# Patient Record
Sex: Male | Born: 1965 | Race: White | Hispanic: Yes | Marital: Married
Health system: Southern US, Community
[De-identification: ages and names within clinical notes are randomized; demographics above are authoritative.]

## PROBLEM LIST (undated history)

## (undated) DIAGNOSIS — IMO0001 Reserved for inherently not codable concepts without codable children: Secondary | ICD-10-CM

---

## 2009-07-22 ENCOUNTER — Observation Stay (HOSPITAL_COMMUNITY): Admission: EM | Admit: 2009-07-22 | Discharge: 2009-07-22 | Payer: Self-pay | Admitting: Emergency Medicine

## 2010-07-25 DIAGNOSIS — IMO0001 Reserved for inherently not codable concepts without codable children: Secondary | ICD-10-CM

## 2010-07-25 HISTORY — DX: Reserved for inherently not codable concepts without codable children: IMO0001

## 2010-10-09 ENCOUNTER — Emergency Department (HOSPITAL_COMMUNITY)
Admission: EM | Admit: 2010-10-09 | Discharge: 2010-10-09 | Disposition: A | Discharge status: Home / Self Care | Payer: Self-pay | Attending: Emergency Medicine | Admitting: Emergency Medicine

## 2010-10-09 DIAGNOSIS — R42 Dizziness and giddiness: Secondary | ICD-10-CM | POA: Insufficient documentation

## 2010-10-09 LAB — GLUCOSE, CAPILLARY: Glucose-Capillary: 90 mg/dL (ref 70–99)

## 2010-10-09 LAB — URINALYSIS, ROUTINE W REFLEX MICROSCOPIC
Bilirubin Urine: NEGATIVE
Glucose, UA: NEGATIVE mg/dL
Hgb urine dipstick: NEGATIVE
Protein, ur: NEGATIVE mg/dL
Urobilinogen, UA: 1 mg/dL (ref 0.0–1.0)

## 2010-10-25 LAB — BASIC METABOLIC PANEL
BUN: 9 mg/dL (ref 6–23)
CO2: 25 mEq/L (ref 19–32)
Calcium: 9.4 mg/dL (ref 8.4–10.5)
GFR calc non Af Amer: >60 mL/min (ref >60)
Glucose, Bld: 116 mg/dL — ABNORMAL HIGH (ref 70–99)
Potassium: 4.1 mEq/L (ref 3.5–5.1)
Sodium: 137 mEq/L (ref 135–145)

## 2010-10-25 LAB — DIFFERENTIAL
Basophils Relative: 1 % (ref 0–1)
Eosinophils Absolute: 0.1 10*3/uL (ref 0.0–0.7)
Eosinophils Relative: 1 % (ref 0–5)
Lymphs Abs: 2 10*3/uL (ref 0.7–4.0)
Monocytes Relative: 7 % (ref 3–12)
Neutrophils Relative %: 70 % (ref 43–77)

## 2010-10-25 LAB — CBC
Hemoglobin: 15 g/dL (ref 13.0–17.0)
MCHC: 34.8 g/dL (ref 30.0–36.0)
MCV: 92.6 fL (ref 78.0–100.0)
RBC: 4.67 MIL/uL (ref 4.22–5.81)
RDW: 12.7 % (ref 11.5–15.5)

## 2010-11-22 ENCOUNTER — Observation Stay (HOSPITAL_COMMUNITY)
Admission: EM | Admit: 2010-11-22 | Discharge: 2010-11-23 | Disposition: A | Discharge status: Home / Self Care | Payer: Self-pay | Attending: Emergency Medicine | Admitting: Emergency Medicine

## 2010-11-22 DIAGNOSIS — R079 Chest pain, unspecified: Principal | ICD-10-CM | POA: Insufficient documentation

## 2010-11-22 LAB — POCT I-STAT, CHEM 8
BUN: 13 mg/dL (ref 6–23)
Calcium, Ion: 1.22 mmol/L (ref 1.12–1.32)
Chloride: 105 meq/L (ref 96–112)
Creatinine, Ser: 1.1 mg/dL (ref 0.4–1.5)
Glucose, Bld: 109 mg/dL — ABNORMAL HIGH (ref 70–99)
HCT: 45 % (ref 39.0–52.0)
Hemoglobin: 15.3 g/dL (ref 13.0–17.0)
Potassium: 4.1 meq/L (ref 3.5–5.1)
Sodium: 141 meq/L (ref 135–145)
TCO2: 27 mmol/L (ref 0–100)

## 2010-11-22 LAB — POCT CARDIAC MARKERS
CKMB, poc: <1 ng/mL — ABNORMAL LOW (ref 1.0–8.0)
Myoglobin, poc: 37.8 ng/mL (ref 12–200)
Troponin i, poc: <0.05 ng/mL (ref 0.00–0.09)

## 2010-11-23 DIAGNOSIS — R072 Precordial pain: Secondary | ICD-10-CM

## 2010-11-23 LAB — TROPONIN I

## 2014-08-10 ENCOUNTER — Emergency Department (HOSPITAL_COMMUNITY): Payer: Self-pay

## 2014-08-10 ENCOUNTER — Emergency Department (HOSPITAL_COMMUNITY)
Admission: EM | Admit: 2014-08-10 | Discharge: 2014-08-10 | Disposition: A | Discharge status: Home / Self Care | Payer: Self-pay | Attending: Emergency Medicine | Admitting: Emergency Medicine

## 2014-08-10 ENCOUNTER — Encounter (HOSPITAL_COMMUNITY): Payer: Self-pay | Admitting: Emergency Medicine

## 2014-08-10 DIAGNOSIS — M25512 Pain in left shoulder: Secondary | ICD-10-CM | POA: Insufficient documentation

## 2014-08-10 DIAGNOSIS — R079 Chest pain, unspecified: Secondary | ICD-10-CM | POA: Insufficient documentation

## 2014-08-10 DIAGNOSIS — R52 Pain, unspecified: Secondary | ICD-10-CM

## 2014-08-10 HISTORY — DX: Reserved for inherently not codable concepts without codable children: IMO0001

## 2014-08-10 LAB — CBC WITH DIFFERENTIAL/PLATELET
Basophils Absolute: 0 10*3/uL (ref 0.0–0.1)
Basophils Relative: 1 % (ref 0–1)
EOS PCT: 2 % (ref 0–5)
Eosinophils Absolute: 0.1 10*3/uL (ref 0.0–0.7)
HCT: 44.4 % (ref 39.0–52.0)
Hemoglobin: 15.4 g/dL (ref 13.0–17.0)
LYMPHS ABS: 2.6 10*3/uL (ref 0.7–4.0)
Lymphocytes Relative: 40 % (ref 12–46)
MCH: 31.3 pg (ref 26.0–34.0)
MCHC: 34.7 g/dL (ref 30.0–36.0)
MCV: 90.2 fL (ref 78.0–100.0)
Monocytes Absolute: 0.4 10*3/uL (ref 0.1–1.0)
Monocytes Relative: 6 % (ref 3–12)
NEUTROS ABS: 3.3 10*3/uL (ref 1.7–7.7)
Neutrophils Relative %: 51 % (ref 43–77)
PLATELETS: 199 10*3/uL (ref 150–400)
RBC: 4.92 MIL/uL (ref 4.22–5.81)
RDW: 12.7 % (ref 11.5–15.5)
WBC: 6.3 10*3/uL (ref 4.0–10.5)

## 2014-08-10 LAB — COMPREHENSIVE METABOLIC PANEL
ALBUMIN: 4 g/dL (ref 3.5–5.2)
ALK PHOS: 57 U/L (ref 39–117)
ALT: 24 U/L (ref 0–53)
AST: 21 U/L (ref 0–37)
Anion gap: 11 (ref 5–15)
BILIRUBIN TOTAL: 0.7 mg/dL (ref 0.3–1.2)
BUN: 10 mg/dL (ref 6–23)
CALCIUM: 9 mg/dL (ref 8.4–10.5)
CHLORIDE: 104 meq/L (ref 96–112)
CO2: 26 mmol/L (ref 19–32)
Creatinine, Ser: 0.98 mg/dL (ref 0.50–1.35)
GLUCOSE: 136 mg/dL — AB (ref 70–99)
Potassium: 3.8 mmol/L (ref 3.5–5.1)
SODIUM: 141 mmol/L (ref 135–145)
TOTAL PROTEIN: 6.7 g/dL (ref 6.0–8.3)

## 2014-08-10 LAB — TROPONIN I: Troponin I: <0.03 ng/mL (ref <0.031)

## 2014-08-10 MED ORDER — IBUPROFEN 800 MG PO TABS: 800.0000 mg | ORAL_TABLET | Freq: Three times a day (TID) | ORAL | Status: AC

## 2014-08-10 MED ORDER — HYDROCODONE-ACETAMINOPHEN 5-325 MG PO TABS: 2.0000 | ORAL_TABLET | ORAL | Status: AC | PRN

## 2014-08-10 NOTE — ED Provider Notes (Signed)
CSN: [id number]     Arrival date & time 08/10/14  0458 History   First MD Initiated Contact with Patient 08/10/14 [phone]     Chief Complaint  Patient presents with  . Shoulder Pain     (Consider location/radiation/quality/duration/timing/severity/associated sxs/prior Treatment) Patient is a 49 y.o. male presenting with chest pain. The history is provided by the patient. No language interpreter was used.  Chest Pain Pain location:  L chest Pain quality: aching   Pain radiates to:  Does not radiate Pain radiates to the back: no   Pain severity:  Moderate Onset quality:  Unable to specify Timing:  Constant Progression:  Resolved Chronicity:  New Relieved by:  Aspirin Worsened by:  Nothing tried Ineffective treatments:  None tried Associated symptoms: no abdominal pain   Risk factors: no high cholesterol, no hypertension, not obese and no smoking     Past Medical History  Diagnosis Date  . Normal cardiac stress test 2012   No past surgical history on file. No family history on file. History  Substance Use Topics  . Smoking status: Not on file  . Smokeless tobacco: Not on file  . Alcohol Use: Not on file    Review of Systems  Cardiovascular: Positive for chest pain.  Gastrointestinal: Negative for abdominal pain.  All other systems reviewed and are negative.     Allergies  Review of patient's allergies indicates no known allergies.  Home Medications   Prior to Admission medications   Not on File   BP 151/85 mmHg  Pulse 80  Temp(Src) 97.4 F (36.3 C) (Oral)  Resp 18  Ht 5\' 8"  (1.727 m)  Wt 220 lb (99.791 kg)  BMI 33.46 kg/m2  SpO2 99% Physical Exam  Constitutional: He is oriented to person, place, and time. He appears well-developed and well-nourished.  HENT:  Head: Normocephalic.  Right Ear: External ear normal.  Left Ear: External ear normal.  Nose: Nose normal.  Mouth/Throat: Oropharynx is clear and moist.  Eyes: Conjunctivae and EOM are normal.  Pupils are equal, round, and reactive to light.  Neck: Normal range of motion.  Cardiovascular: Normal rate.   Pulmonary/Chest: Effort normal and breath sounds normal.  Abdominal: Soft. He exhibits no distension.  Musculoskeletal: Normal range of motion.  Neurological: He is alert and oriented to person, place, and time.  Skin: Skin is warm.  Psychiatric: He has a normal mood and affect.  Nursing note and vitals reviewed.   ED Course  Procedures (including critical care time) Labs Review Labs Reviewed - No data to display  Imaging Review Dg Chest 2 View  08/10/2014   CLINICAL DATA:  Left back, shoulder and arm pain since this morning.  EXAM: CHEST  2 VIEW  COMPARISON:  None.  FINDINGS: Normal sized heart. Mild diffuse peribronchial thickening. Normal vascularity. Mild thoracic spine degenerative changes.  IMPRESSION: Mild chronic bronchitic changes.  No acute abnormality.   Electronically Signed   By: Gordan PaymentSteve  Reid M.D.   On: 08/10/2014 08:21   Results for orders placed or performed during the hospital encounter of 08/10/14  CBC with Differential  Result Value Ref Range   WBC 6.3 4.0 - 10.5 K/uL   RBC 4.92 4.22 - 5.81 MIL/uL   Hemoglobin 15.4 13.0 - 17.0 g/dL   HCT 11.944.4 14.739.0 - 82.952.0 %   MCV 90.2 78.0 - 100.0 fL   MCH 31.3 26.0 - 34.0 pg   MCHC 34.7 30.0 - 36.0 g/dL   RDW 56.212.7 13.011.5 - 86.515.5 %  Platelets 199 150 - 400 K/uL   Neutrophils Relative % 51 43 - 77 %   Neutro Abs 3.3 1.7 - 7.7 K/uL   Lymphocytes Relative 40 12 - 46 %   Lymphs Abs 2.6 0.7 - 4.0 K/uL   Monocytes Relative 6 3 - 12 %   Monocytes Absolute 0.4 0.1 - 1.0 K/uL   Eosinophils Relative 2 0 - 5 %   Eosinophils Absolute 0.1 0.0 - 0.7 K/uL   Basophils Relative 1 0 - 1 %   Basophils Absolute 0.0 0.0 - 0.1 K/uL  Comprehensive metabolic panel  Result Value Ref Range   Sodium 141 135 - 145 mmol/L   Potassium 3.8 3.5 - 5.1 mmol/L   Chloride 104 96 - 112 mEq/L   CO2 26 19 - 32 mmol/L   Glucose, Bld 136 (H) 70 - 99  mg/dL   BUN 10 6 - 23 mg/dL   Creatinine, Ser 1.61 0.50 - 1.35 mg/dL   Calcium 9.0 8.4 - 09.6 mg/dL   Total Protein 6.7 6.0 - 8.3 g/dL   Albumin 4.0 3.5 - 5.2 g/dL   AST 21 0 - 37 U/L   ALT 24 0 - 53 U/L   Alkaline Phosphatase 57 39 - 117 U/L   Total Bilirubin 0.7 0.3 - 1.2 mg/dL   GFR calc non Af Amer >90 >90 mL/min   GFR calc Af Amer >90 >90 mL/min   Anion gap 11 5 - 15  Troponin I  Result Value Ref Range   Troponin I <0.03 <0.031 ng/mL  Troponin I  Result Value Ref Range   Troponin I <0.03 <0.031 ng/mL   Dg Chest 2 View  08/10/2014   CLINICAL DATA:  Left back, shoulder and arm pain since this morning.  EXAM: CHEST  2 VIEW  COMPARISON:  None.  FINDINGS: Normal sized heart. Mild diffuse peribronchial thickening. Normal vascularity. Mild thoracic spine degenerative changes.  IMPRESSION: Mild chronic bronchitic changes.  No acute abnormality.   Electronically Signed   By: Gordan Payment M.D.   On: 08/10/2014 08:21      EKG Interpretation   Date/Time:  Sunday August 10 2014 05:05:35 EST Ventricular Rate:  75 PR Interval:  144 QRS Duration: 109 QT Interval:  378 QTC Calculation: 422 R Axis:   -128 Text Interpretation:  Sinus rhythm Right axis deviation When compared with  ECG of 11/22/2010 No significant change was found Confirmed by [facility]   MD, KATHLEEN ([id number]) on 08/10/2014 6:19:59 AM      MDM troponin negative x 2.  Pt reports pain resolved before arrival.  Pt reports pain was more in shoulder and neck.  I doubt cardiac etiology.   I suspect pain was muscular or radicular.  I counseled pt.  Pt advised primary care followup.  I will treat with ibuprofen and hydrocodone.   Final diagnoses:  Pain  Left shoulder pain        Elson Areas, PA-C 08/10/14 1416  Merrie Roof, MD 08/11/14 [phone]

## 2014-08-10 NOTE — ED Notes (Signed)
Pt. Took 2 Aspirin prior to coming into ED and the shoulder pain had stopped.  Pt. Denies any chest pain.

## 2014-08-10 NOTE — ED Notes (Signed)
Pt reports being woken up by left arm, shoulder and back pain. Pt denies chest pain.

## 2014-08-10 NOTE — Discharge Instructions (Signed)

## 2016-03-14 IMAGING — CR DG CHEST 2V
2 series · 2 of 2 positions shown · non-contrast
Comparison: None.

CLINICAL DATA: Left back, shoulder and arm pain since this morning.

EXAM:
CHEST  2 VIEW

[chest pa]
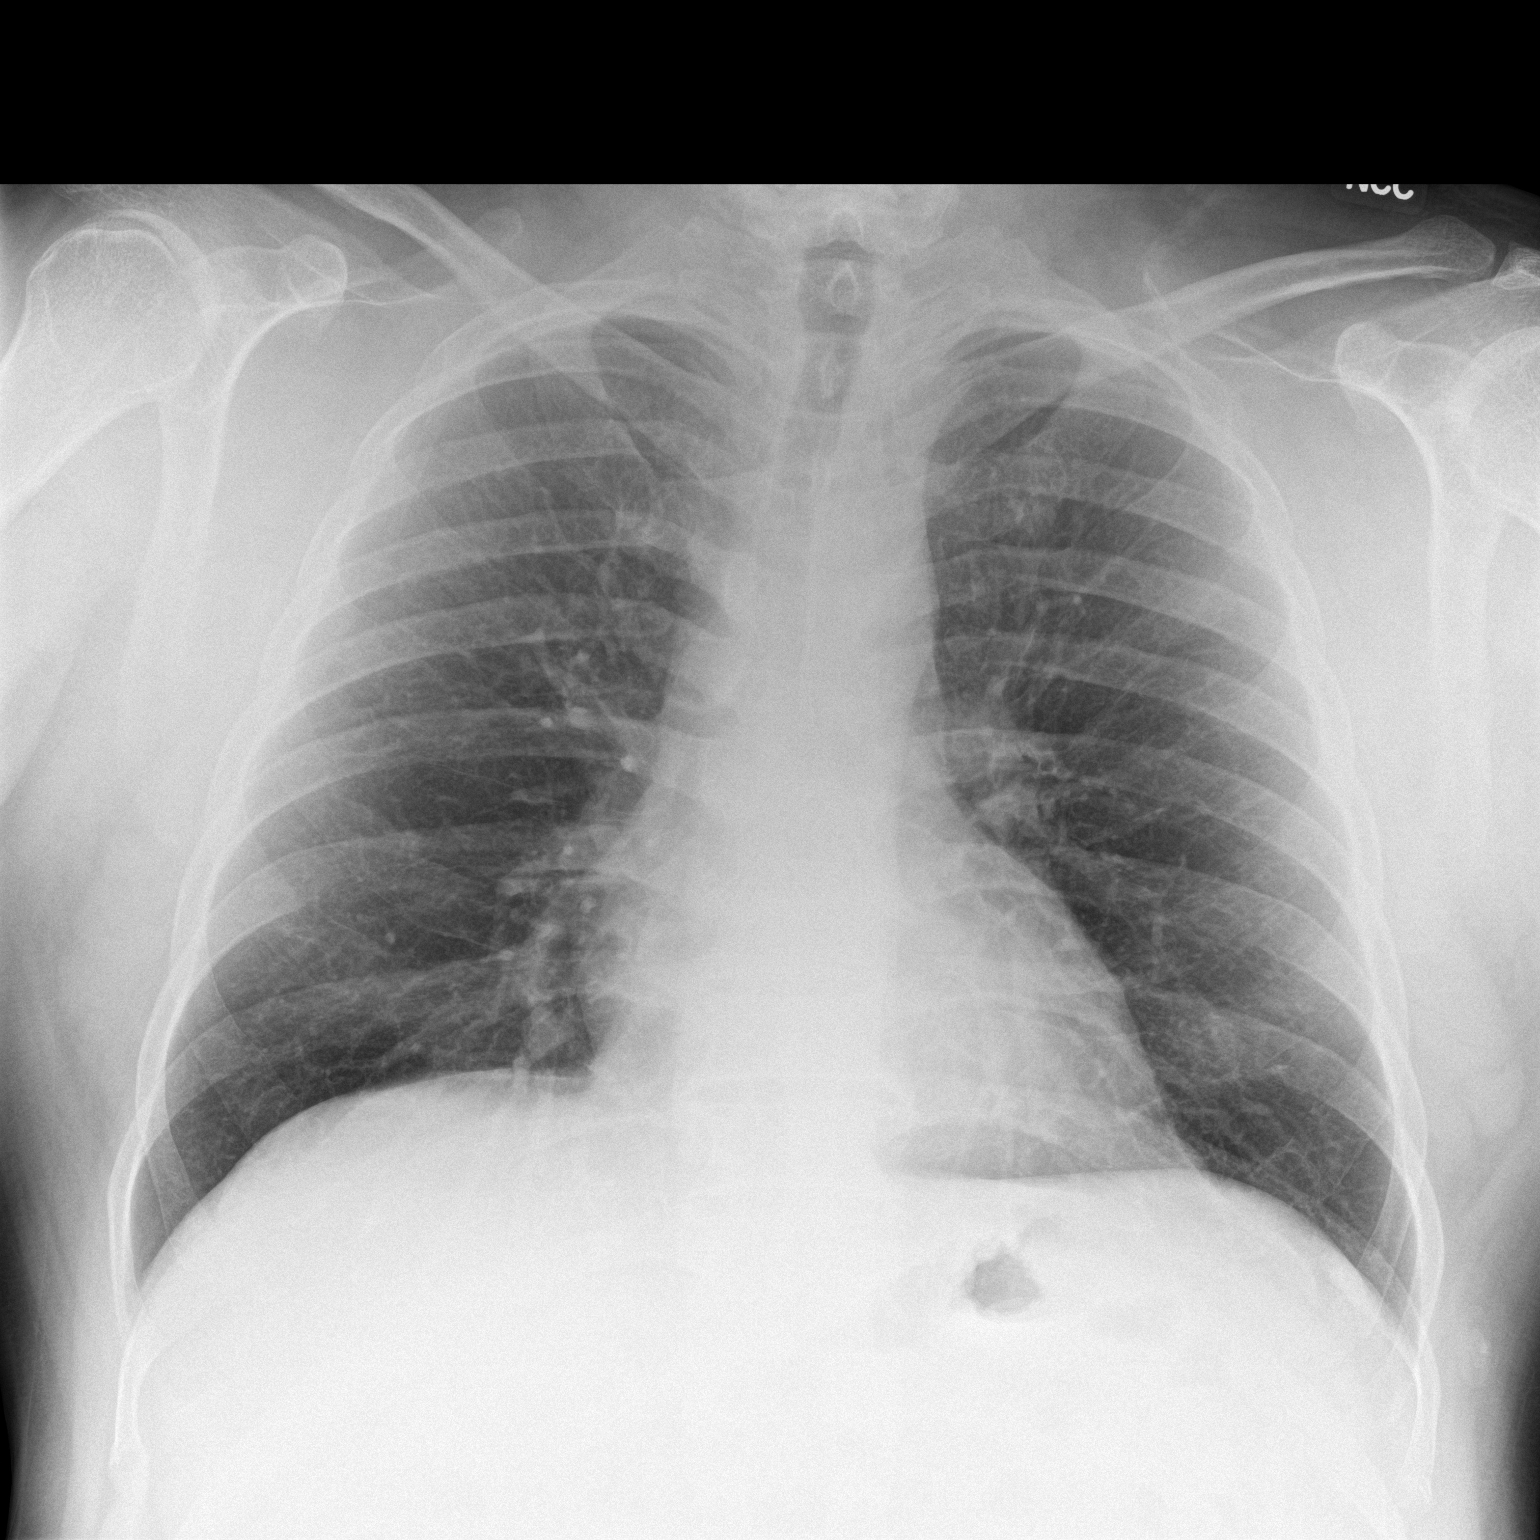

[chest lat]
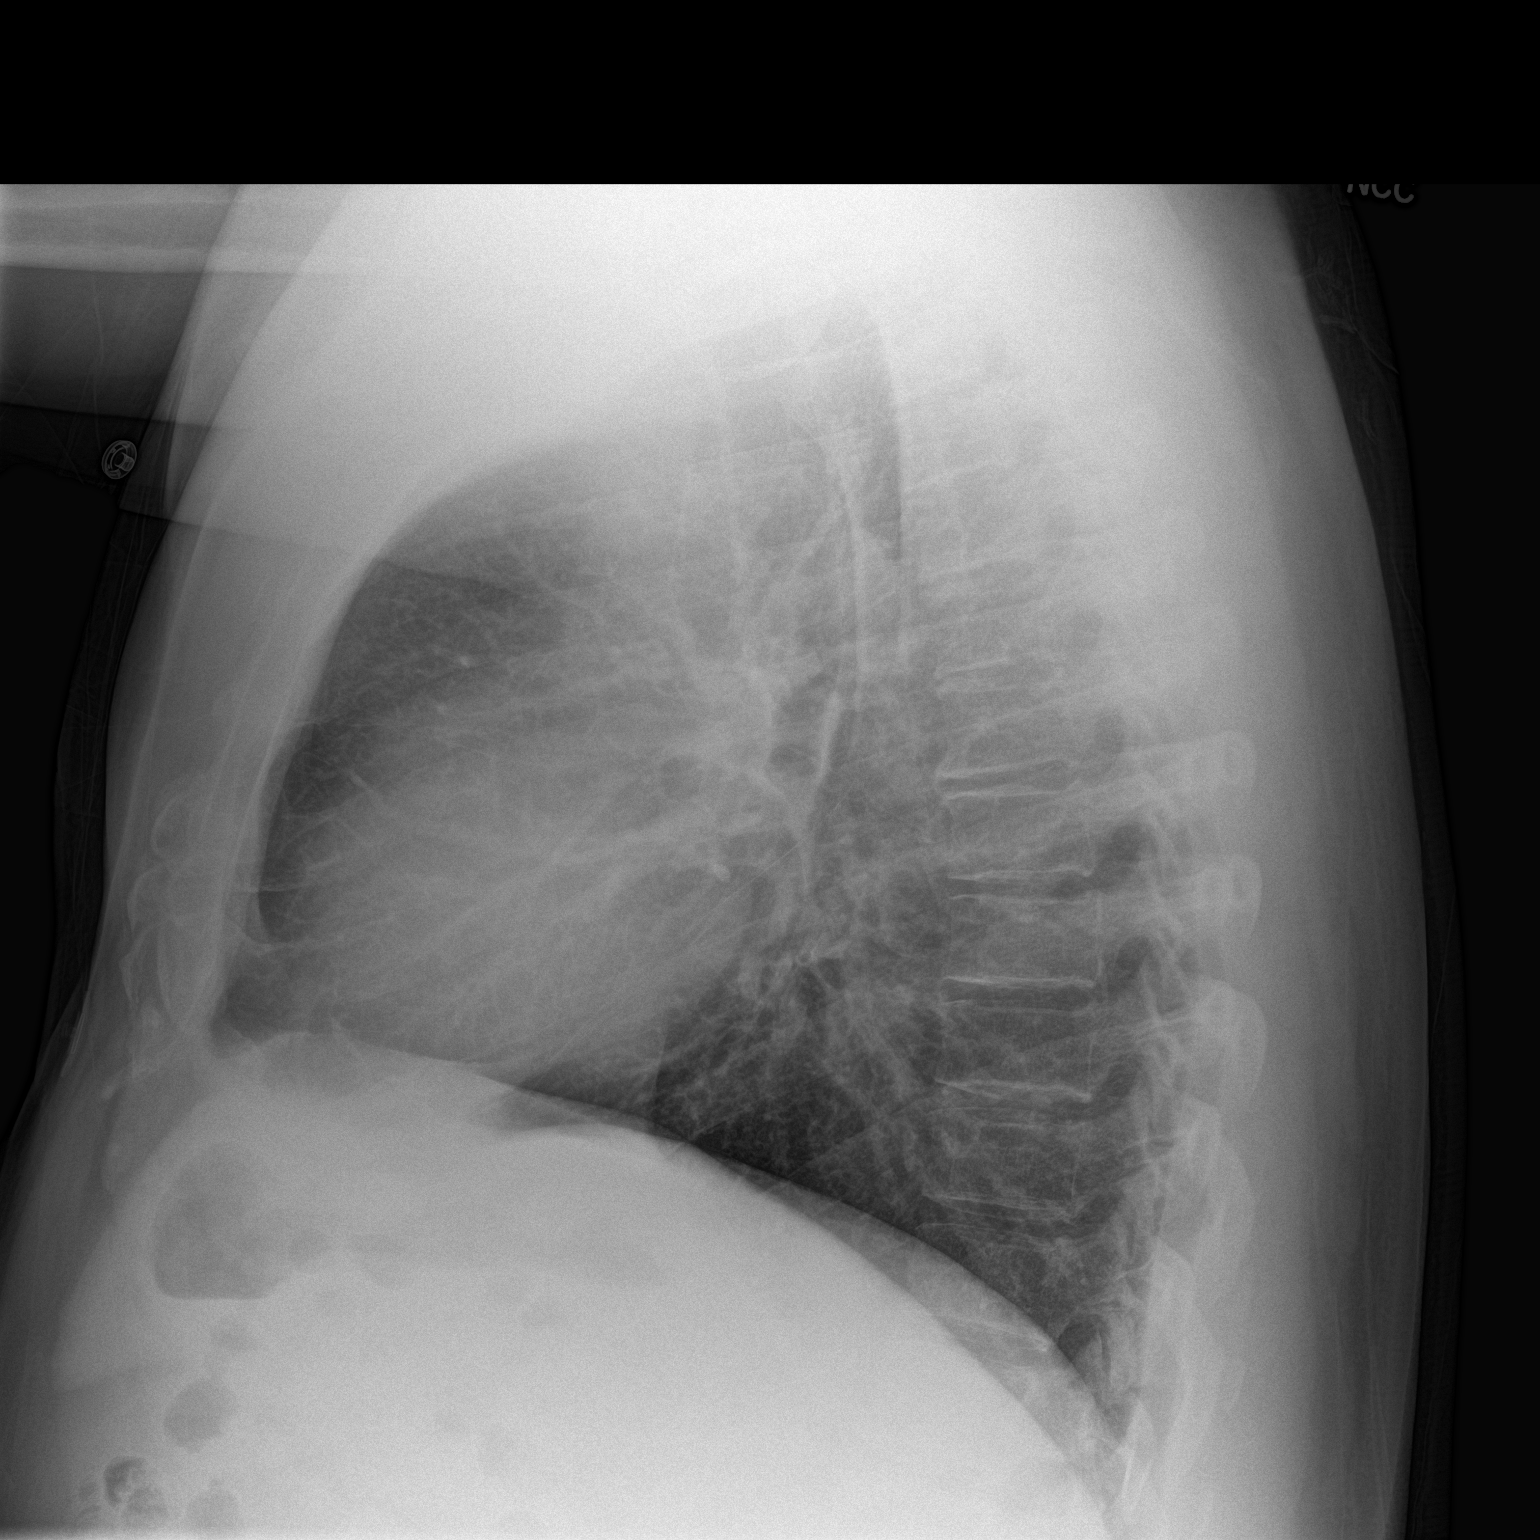

[2 of 2 positions shown; findings below may reference images not displayed]

FINDINGS: Normal sized heart. Mild diffuse peribronchial thickening. Normal
vascularity. Mild thoracic spine degenerative changes.
IMPRESSION: Mild chronic bronchitic changes.  No acute abnormality.
